# Patient Record
Sex: Male | Born: 1985 | Race: White | Hispanic: No | Marital: Single | Smoking: Current every day smoker
Health system: Southern US, Community
[De-identification: ages and names within clinical notes are randomized; demographics above are authoritative.]

## PROBLEM LIST (undated history)

## (undated) DIAGNOSIS — G4700 Insomnia, unspecified: Secondary | ICD-10-CM

## (undated) DIAGNOSIS — K219 Gastro-esophageal reflux disease without esophagitis: Secondary | ICD-10-CM

## (undated) HISTORY — DX: Gastro-esophageal reflux disease without esophagitis: K21.9

## (undated) HISTORY — PX: NO PAST SURGERIES: SHX2092

## (undated) HISTORY — DX: Insomnia, unspecified: G47.00

---

## 2014-07-18 ENCOUNTER — Encounter: Payer: Self-pay | Admitting: *Deleted

## 2014-07-19 ENCOUNTER — Institutional Professional Consult (permissible substitution): Payer: Self-pay | Admitting: Pulmonary Disease

## 2014-08-22 ENCOUNTER — Encounter: Payer: Self-pay | Admitting: *Deleted

## 2014-08-23 ENCOUNTER — Ambulatory Visit (INDEPENDENT_AMBULATORY_CARE_PROVIDER_SITE_OTHER): Payer: Self-pay | Admitting: Pulmonary Disease

## 2014-08-23 ENCOUNTER — Other Ambulatory Visit (INDEPENDENT_AMBULATORY_CARE_PROVIDER_SITE_OTHER): Payer: Self-pay

## 2014-08-23 ENCOUNTER — Encounter: Payer: Self-pay | Admitting: Pulmonary Disease

## 2014-08-23 ENCOUNTER — Ambulatory Visit (INDEPENDENT_AMBULATORY_CARE_PROVIDER_SITE_OTHER)
Admission: RE | Admit: 2014-08-23 | Discharge: 2014-08-23 | Disposition: A | Discharge status: Home / Self Care | Payer: Self-pay | Source: Ambulatory Visit | Attending: Pulmonary Disease | Admitting: Pulmonary Disease

## 2014-08-23 ENCOUNTER — Encounter (INDEPENDENT_AMBULATORY_CARE_PROVIDER_SITE_OTHER): Payer: Self-pay

## 2014-08-23 VITALS — BP 146/82 | HR 76 | Temp 98.5°F | Ht 68.0 in | Wt 240.6 lb

## 2014-08-23 DIAGNOSIS — J45909 Unspecified asthma, uncomplicated: Secondary | ICD-10-CM

## 2014-08-23 DIAGNOSIS — D72829 Elevated white blood cell count, unspecified: Secondary | ICD-10-CM

## 2014-08-23 DIAGNOSIS — G4733 Obstructive sleep apnea (adult) (pediatric): Secondary | ICD-10-CM | POA: Insufficient documentation

## 2014-08-23 LAB — CBC WITH DIFFERENTIAL/PLATELET
Basophils Absolute: 0.1 10*3/uL (ref 0.0–0.1)
Basophils Relative: 0.5 % (ref 0.0–3.0)
EOS PCT: 4 % (ref 0.0–5.0)
Eosinophils Absolute: 0.8 10*3/uL — ABNORMAL HIGH (ref 0.0–0.7)
HCT: 48.3 % (ref 39.0–52.0)
HEMOGLOBIN: 16.4 g/dL (ref 13.0–17.0)
LYMPHS PCT: 28.8 % (ref 12.0–46.0)
Lymphs Abs: 5.8 10*3/uL — ABNORMAL HIGH (ref 0.7–4.0)
MCHC: 33.9 g/dL (ref 30.0–36.0)
MCV: 90 fl (ref 78.0–100.0)
Monocytes Absolute: 0.9 10*3/uL (ref 0.1–1.0)
Monocytes Relative: 4.4 % (ref 3.0–12.0)
NEUTROS ABS: 12.4 10*3/uL — AB (ref 1.4–7.7)
Neutrophils Relative %: 62.3 % (ref 43.0–77.0)
Platelets: 331 10*3/uL (ref 150.0–400.0)
RBC: 5.36 Mil/uL (ref 4.22–5.81)
RDW: 13.8 % (ref 11.5–15.5)
WBC: 20 10*3/uL — AB (ref 4.0–10.5)

## 2014-08-23 NOTE — Assessment & Plan Note (Signed)
Breathing test appears OK Stay on symbicort 160  Blood work for allergies -CBC, RAST Use albuterol 2 puffs  as needed for wheezing Smoking cessation paramount

## 2014-08-23 NOTE — Progress Notes (Signed)
Subjective:    Patient ID: Alexis Shaw, male    DOB: Nov 27, 1986, 28 y.o.   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 This got worse in his 57s, and now he has symptoms all year-round.he reports occasional wheezing and an early morning cough productive of clear white phlegm. He reports right-sided chest pain for at least 3 years which is worse when sitting and driving, he denies worsening chest pain on exertion or dyspnea. He had emergency room visits for his chest pain, but has not had extensive cardiac testing.  He smokes one to 2 packs per day since age 75= 15 pack years. He was put on Symbicort which provided some relief, however this is very expensive since he does not have insurance, he also uses albuterol as needed.  Spirometry did not show any evidence of airway obstruction with FEV1 3.94-93% and FVC 101% with ratio 77.  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 If anything he reports difficulty falling asleep. Bedtime could be as late as midnight, and he wakes up as late as 9 AM if he does not have to drive out early. Sleep latency is minimal, he sleeps on his side with 2 pillows, 2-3 nocturnal awakenings, feels rested when he wakes up and denies headaches or dryness of mouth. He is gained about 40 pounds in the last 3 years. There is no history suggestive of cataplexy, sleep paralysis or parasomnias . Epworth sleepiness score is 4  Labs-WBC 20, eosinophils 800  Past Medical History  Diagnosis Date  . GERD (gastroesophageal reflux disease)   . Insomnia     Past Surgical History  Procedure Laterality Date  . No past surgeries      Allergies  Allergen Reactions  . Erythromycin     History   Social History  . Marital Status: Single    Spouse Name: N/A    Number of  Children: 2  . Years of Education: N/A   Occupational History  . Truck Financial trader    Social History Main Topics  . Smoking status: Current Every Day Smoker -- 2.00 packs/day for 14 years    Types: Cigarettes  . Smokeless tobacco: Not on file     Comment: at times smokes more than 2 PPD  . Alcohol Use: Yes     Comment: moderate -- 12 pack per week  . Drug Use: No  . Sexual Activity: Not on file   Other Topics Concern  . Not on file   Social History Narrative  . No narrative on file    Family History  Problem Relation Age of Onset  . Asthma Father   . Diabetes Mother   . Diabetes Father   . Heart disease Mother   . Hyperlipidemia Mother   . Hypertension Mother   . Cancer Maternal Grandmother     deceased  . Cancer Paternal Uncle     great uncle- deceased  . Cancer Maternal Grandfather     deceased  . Cancer Paternal Aunt     Review of Systems  Constitutional: Positive for appetite change and unexpected weight change. Negative for fever.  HENT: Positive for congestion. Negative for dental problem, ear pain, nosebleeds, postnasal drip, rhinorrhea, sinus pressure, sneezing, sore throat and trouble swallowing.   Eyes: Negative for redness and itching.  Respiratory: Positive for cough and shortness of breath. Negative for chest tightness and wheezing.   Cardiovascular: Positive for chest pain ( sharp/dull/irritating). Negative for palpitations and leg swelling.       Occurs more so while driving truck(occupation)  Gastrointestinal: Positive for abdominal pain. Negative for nausea and vomiting.       Acid heartburn  Genitourinary: Negative for dysuria.  Musculoskeletal: Negative for joint swelling.  Skin: Negative for rash.  Neurological: Negative for headaches.  Hematological: Does not bruise/bleed easily.  Psychiatric/Behavioral: Negative for dysphoric mood. The patient is not nervous/anxious.        Objective:   Physical Exam  Gen. Pleasant, well-nourished,  in no distress, normal affect ENT - no lesions, no post nasal drip Neck: No JVD, no thyromegaly, no carotid bruits Lungs: no use of accessory muscles, no dullness to percussion, clear without rales or rhonchi  Cardiovascular: Rhythm regular, heart sounds  normal, no murmurs or gallops, no peripheral edema Abdomen: soft and non-tender, no hepatosplenomegaly, BS normal. Musculoskeletal: No deformities, no cyanosis or clubbing Neuro:  alert, non focal       Assessment & Plan:

## 2014-08-23 NOTE — Assessment & Plan Note (Signed)
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

## 2014-08-23 NOTE — Assessment & Plan Note (Signed)
Unclear cause, chest x-ray is clear, no symptoms of UTI. We'll treat for bronchitis with doxycycline, and repeat CBC in one week. He will call for any new symptoms

## 2014-08-23 NOTE — Patient Instructions (Signed)
Breathing test appears OK Stay on symbicort 160  Blood work for allergies -CBC, RAST Use albuterol 2 puffs  as needed for wheezing Home sleep test

## 2014-08-24 ENCOUNTER — Telehealth: Payer: Self-pay | Admitting: Pulmonary Disease

## 2014-08-24 ENCOUNTER — Other Ambulatory Visit: Payer: Self-pay | Admitting: Pulmonary Disease

## 2014-08-24 DIAGNOSIS — J45909 Unspecified asthma, uncomplicated: Secondary | ICD-10-CM

## 2014-08-24 LAB — ALLERGY FULL PROFILE
Allergen, D pternoyssinus,d7: <0.1 kU/L
Allergen,Goose feathers, e70: <0.1 kU/L
Aspergillus fumigatus, m3: <0.1 kU/L
CANDIDA ALBICANS: 0.12 kU/L — AB
Common Ragweed: <0.1 kU/L
D. farinae: <0.1 kU/L
Dog Dander: <0.1 kU/L
Fescue: <0.1 kU/L
G005 Rye, Perennial: <0.1 kU/L
Goldenrod: <0.1 kU/L
Helminthosporium halodes: <0.1 kU/L
House Dust Hollister: <0.1 kU/L
IgE (Immunoglobulin E), Serum: 247 kU/L — ABNORMAL HIGH (ref <115)
Oak: <0.1 kU/L
Plantain: <0.1 kU/L
Stemphylium Botryosum: <0.1 kU/L
Sycamore Tree: <0.1 kU/L
Timothy Grass: <0.1 kU/L

## 2014-08-24 MED ORDER — DOXYCYCLINE HYCLATE 100 MG PO TABS: 100.0000 mg | ORAL_TABLET | Freq: Every day | ORAL | Status: AC

## 2014-08-24 NOTE — Telephone Encounter (Signed)
Pt seen in office on day prior to this encounter and was prescribed doxycycline for bronchitis. 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 Is it OK for him to go? 2) should he keep taking the doxycycline?   I explained that the discomfort is likely dyspepsia or GERD and that it might or might not be related to the doxy.   I recommended that he may drive to [place] as planned and that he should take some OTC antacid therapy if the symptoms recur tonight. Further recommended that he continue the medication as prescribed but if he suffers a similar event after subsequent doses, he should contact Dr Vassie Loll for further instructions.  If pain is unrelenting, related to exertion or has character of heaviness or pressure, he is to seek urgent medical attention   Billy Fischer, MD ; [facility] service Mobile [phone].  After 5:30 PM or weekends, call [phone]

## 2014-08-24 NOTE — Telephone Encounter (Signed)
lmomtcb x1 

## 2014-08-25 NOTE — Telephone Encounter (Signed)
Hope his chest pain is better - doubt doxy is causing this Allergy panel neg Toe nail can cause WBC count high if infected/has pus there. Repeat WBC count important after completing abx

## 2014-08-25 NOTE — Telephone Encounter (Signed)
lmomtcb x1 for pt 

## 2014-08-25 NOTE — Telephone Encounter (Signed)
Spoke with pt and advised of Dr Reginia Naas recommendations.  Pt states toe is sore but no puss has been noted.  Pt will be coming for repeat CBC after finishing abx.

## 2014-08-25 NOTE — Telephone Encounter (Signed)
Called spoke with pt. He reports he has an ingrown toe nail on foot. He wants to know if this could cause his WBC to be high since CXR is clear.  Please advise RA thanks

## 2014-09-01 ENCOUNTER — Telehealth: Payer: Self-pay | Admitting: *Deleted

## 2014-09-01 ENCOUNTER — Other Ambulatory Visit (INDEPENDENT_AMBULATORY_CARE_PROVIDER_SITE_OTHER): Payer: Self-pay

## 2014-09-01 DIAGNOSIS — J45909 Unspecified asthma, uncomplicated: Secondary | ICD-10-CM

## 2014-09-01 DIAGNOSIS — D72829 Elevated white blood cell count, unspecified: Secondary | ICD-10-CM

## 2014-09-01 LAB — CBC WITH DIFFERENTIAL/PLATELET
BASOS ABS: 0.1 10*3/uL (ref 0.0–0.1)
Basophils Relative: 0.3 % (ref 0.0–3.0)
EOS PCT: 2.9 % (ref 0.0–5.0)
Eosinophils Absolute: 0.6 10*3/uL (ref 0.0–0.7)
HEMATOCRIT: 45.9 % (ref 39.0–52.0)
HEMOGLOBIN: 15.6 g/dL (ref 13.0–17.0)
LYMPHS ABS: 4.5 10*3/uL — AB (ref 0.7–4.0)
Lymphocytes Relative: 23.7 % (ref 12.0–46.0)
MCHC: 33.9 g/dL (ref 30.0–36.0)
MCV: 89.7 fl (ref 78.0–100.0)
Monocytes Absolute: 0.9 10*3/uL (ref 0.1–1.0)
Monocytes Relative: 4.5 % (ref 3.0–12.0)
NEUTROS ABS: 13 10*3/uL — AB (ref 1.4–7.7)
Neutrophils Relative %: 68.6 % (ref 43.0–77.0)
Platelets: 322 10*3/uL (ref 150.0–400.0)
RBC: 5.12 Mil/uL (ref 4.22–5.81)
RDW: 13.8 % (ref 11.5–15.5)
WBC: 19 10*3/uL (ref 4.0–10.5)

## 2014-09-01 LAB — COMPREHENSIVE METABOLIC PANEL
ALT: 54 U/L — ABNORMAL HIGH (ref 0–53)
AST: 34 U/L (ref 0–37)
Albumin: 4.5 g/dL (ref 3.5–5.2)
Alkaline Phosphatase: 78 U/L (ref 39–117)
BUN: 10 mg/dL (ref 6–23)
CO2: 27 mEq/L (ref 19–32)
Calcium: 10 mg/dL (ref 8.4–10.5)
Chloride: 104 mEq/L (ref 96–112)
Creatinine, Ser: 1 mg/dL (ref 0.4–1.5)
GFR: 96.06 mL/min (ref >60.00)
Glucose, Bld: 79 mg/dL (ref 70–99)
Potassium: 4.3 mEq/L (ref 3.5–5.1)
Sodium: 139 mEq/L (ref 135–145)
Total Bilirubin: 0.3 mg/dL (ref 0.2–1.2)
Total Protein: 7.8 g/dL (ref 6.0–8.3)

## 2014-09-01 NOTE — Telephone Encounter (Addendum)
Received a call from the lab. Pt WBC count is 19.0. I will forward message to Dr. Vassie LollAlva. Carron CurieJennifer Linzy Darling, CMA

## 2014-09-02 NOTE — Telephone Encounter (Signed)
Called and spoke with pt and he stated that he has not had any fever or any signs of infection.  He stated that he will be referred to a podiatrist--not sure of the appt yet---to see about removing a toe nail.  He will keep RA updated on any changes.  Order for repeat labs to be done in  1 month.  Will forward back to RA as an FYI.

## 2014-09-02 NOTE — Telephone Encounter (Signed)
Called pt LMTCB x1 

## 2014-09-02 NOTE — Telephone Encounter (Signed)
[phone] returning a call

## 2014-09-02 NOTE — Telephone Encounter (Signed)
Pl let him know WBC count remains high. Has he had fever or any other signs of infection? If not, then suggest rpt CBC in 1 month, if remains high -will need referral to hematologist

## 2014-09-06 ENCOUNTER — Telehealth: Payer: Self-pay | Admitting: Pulmonary Disease

## 2014-09-06 NOTE — Telephone Encounter (Signed)
lmtcb x1 for Tammy. 

## 2014-09-07 NOTE — Telephone Encounter (Signed)
Tammy returning call.  [phone] ext. [phone]

## 2014-09-07 NOTE — Telephone Encounter (Signed)
Spoke with Tammy at Dr Lovina Reachampbells office.  Requesting update/plan on pt WBC and treatment.  Advised of phone notes 9/23 and 10/1 Current plan is for patient to see Podiatrist and have WBC ct rechecked in 1 mo (10/2014) If WBC ct still elevated, referral to Hematologist to be placed.  Oretha Milchakesh Alva V, MD at 09/02/2014 1:22 AM     Status: Signed        Pl let him know WBC count remains high.  Has he had fever or any other signs of infection?  If not, then suggest rpt CBC in 1 month, if remains high -will need referral to hematologist   Tammy states that she will let Dr Orvan Falconerampbell know and will also reiterate these rec's to the patient.  Nothing further needed.

## 2014-09-08 ENCOUNTER — Ambulatory Visit: Payer: Self-pay

## 2014-09-22 ENCOUNTER — Ambulatory Visit: Payer: Self-pay | Admitting: Adult Health

## 2014-10-03 ENCOUNTER — Ambulatory Visit: Payer: Self-pay

## 2014-11-08 ENCOUNTER — Telehealth: Payer: Self-pay | Admitting: Pulmonary Disease

## 2014-11-08 NOTE — Telephone Encounter (Signed)
I have tried to reach this patient on 3 separate occasions (10/05/14, 10/14/14 & 10/31/14) to schedule him to pick up an Alice machine for his HST.  I have not been able to speak with the patient as I have gotten his voice mail each time.  Each time I left the pt a detailed message with my direct phone number to call me back.  Pt has not called me back nor has he left any message on my voice mail.  Order has been placed in Libby's office in the folder for pts we are unable to reach to schedule their HST [place]

## 2014-11-17 ENCOUNTER — Encounter: Payer: Self-pay | Admitting: Emergency Medicine

## 2014-11-17 NOTE — Telephone Encounter (Signed)
Verified with Almyra FreeLibby that pt has NOT returned call to schedule HST Will sign off on message and forward to RA to make him aware.

## 2014-11-17 NOTE — Telephone Encounter (Signed)
Letter generated and placed in out going mail. Nothing further needed.

## 2014-11-17 NOTE — Telephone Encounter (Signed)
Please send him a letter. He does not have a follow up scheduled either

## 2015-09-22 IMAGING — CR DG CHEST 2V
2 series · 2 of 2 positions shown · non-contrast
Comparison: None.

CLINICAL DATA: Substernal, right-sided chest pain.

EXAM:
CHEST  2 VIEW

[view not recorded (1 of 2)]
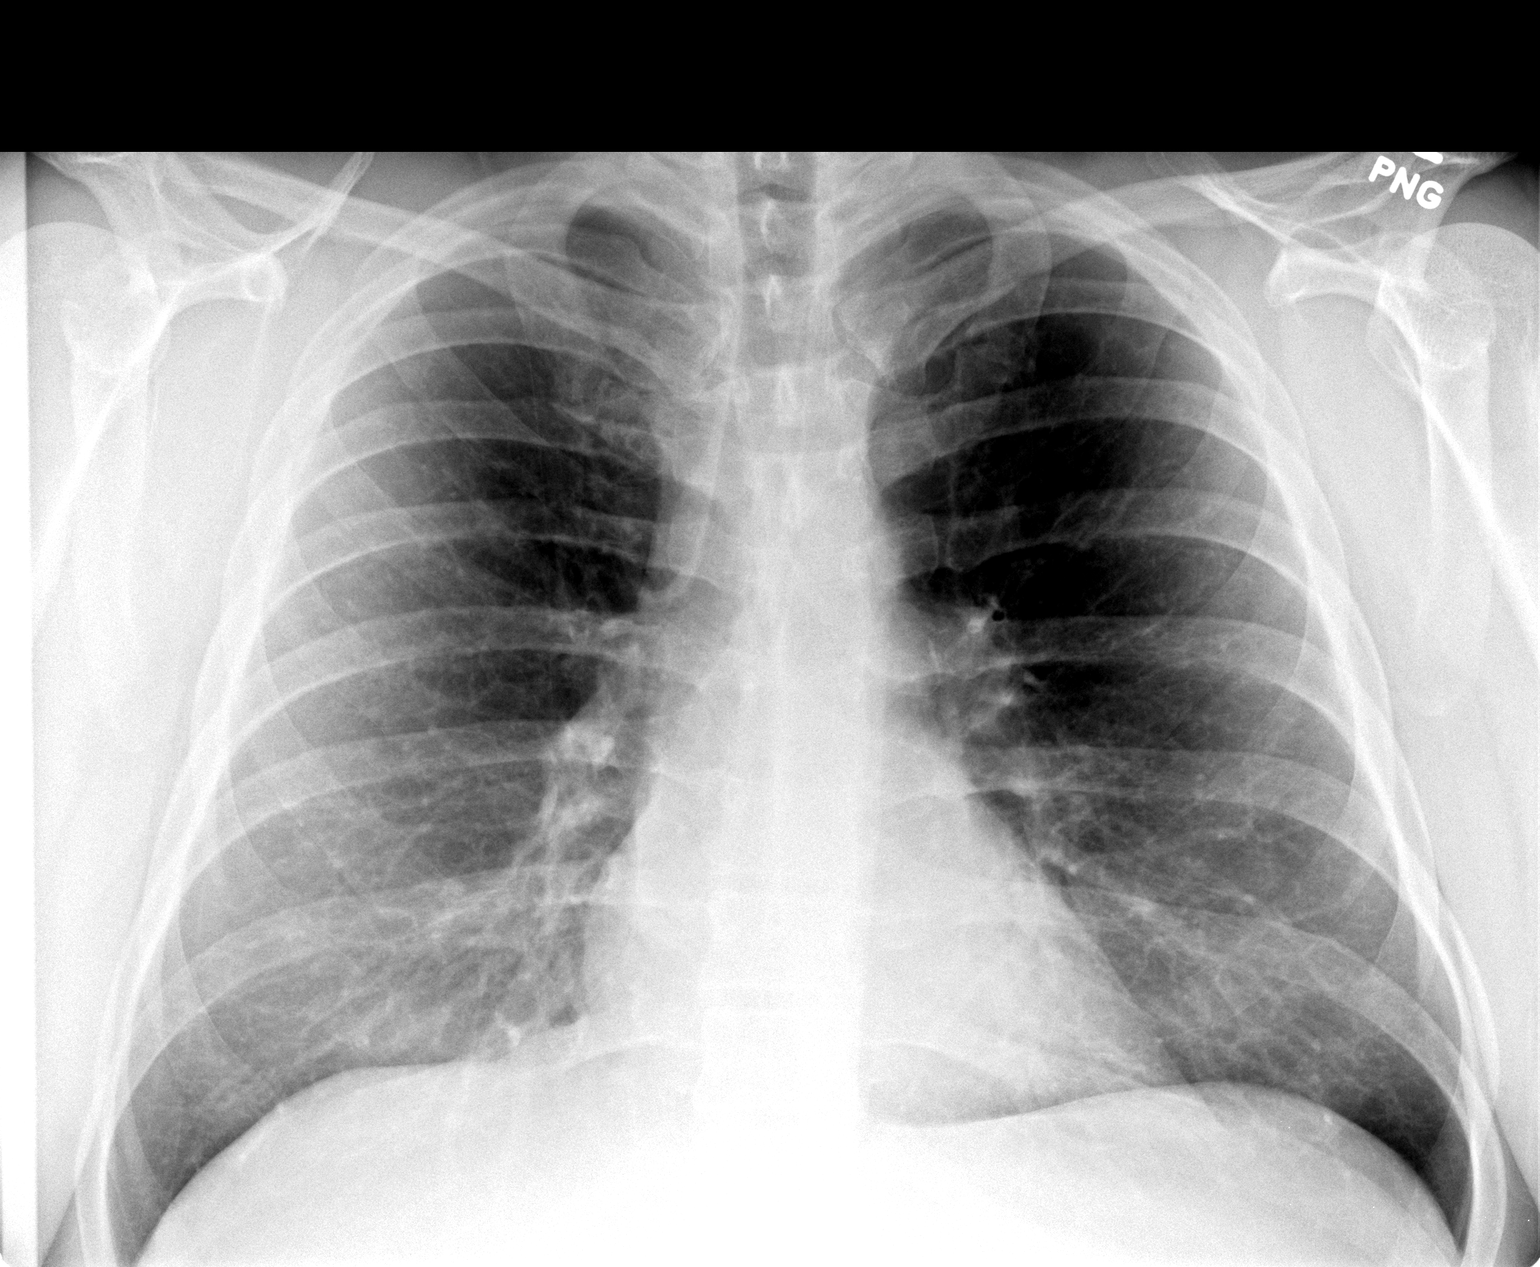

[view not recorded (2 of 2)]
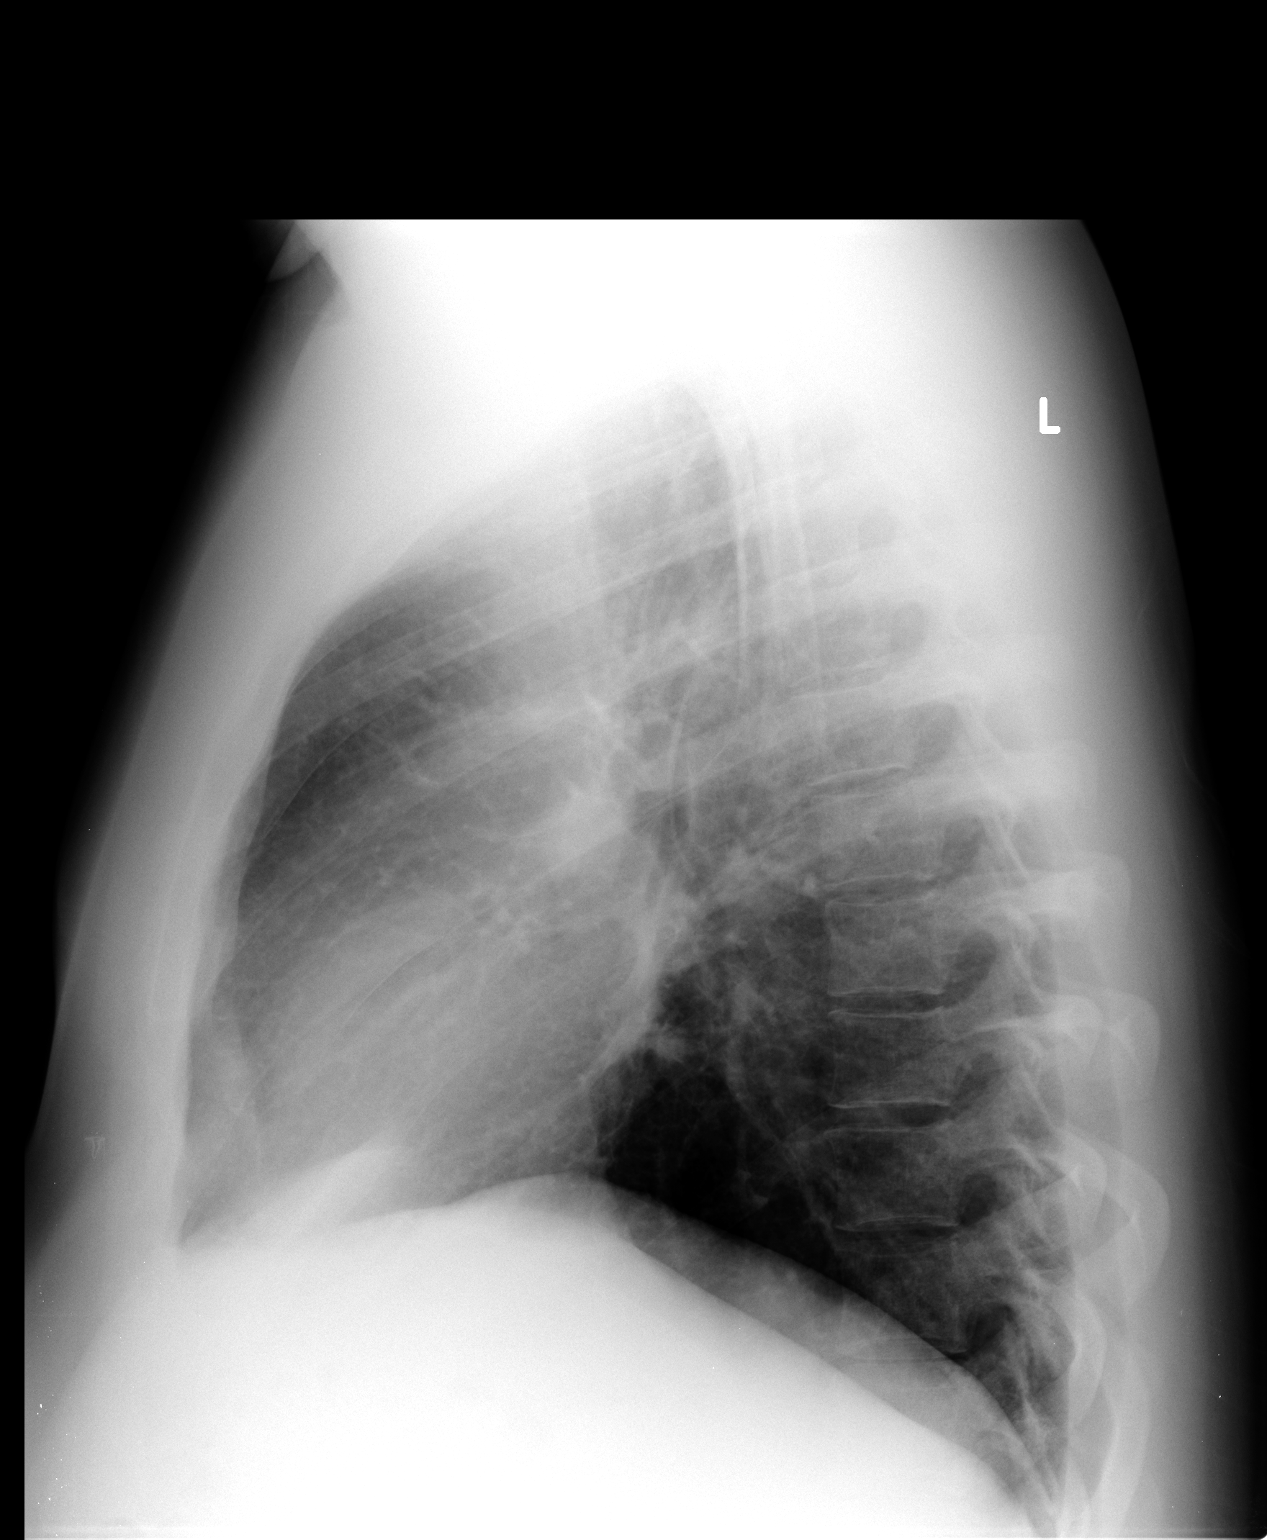

[2 of 2 positions shown; findings below may reference images not displayed]

FINDINGS: The heart size and mediastinal contours are within normal limits.
Both lungs are clear. The visualized skeletal structures are
unremarkable.
IMPRESSION: No active cardiopulmonary disease.

## 2017-05-08 ENCOUNTER — Other Ambulatory Visit: Payer: Self-pay | Admitting: *Deleted

## 2017-05-08 DIAGNOSIS — I739 Peripheral vascular disease, unspecified: Secondary | ICD-10-CM

## 2017-06-26 ENCOUNTER — Encounter: Payer: Self-pay | Admitting: Vascular Surgery

## 2017-07-15 ENCOUNTER — Ambulatory Visit (HOSPITAL_COMMUNITY): Payer: Self-pay

## 2017-07-15 ENCOUNTER — Encounter: Payer: Self-pay | Admitting: Vascular Surgery

## 2017-09-02 ENCOUNTER — Inpatient Hospital Stay (HOSPITAL_COMMUNITY): Admission: RE | Admit: 2017-09-02 | Payer: Self-pay | Source: Ambulatory Visit

## 2017-09-02 ENCOUNTER — Encounter: Payer: Self-pay | Admitting: Vascular Surgery

## 2024-10-07 ENCOUNTER — Other Ambulatory Visit: Payer: Self-pay | Admitting: Critical Care Medicine

## 2024-10-07 NOTE — Progress Notes (Signed)
 Opened in error
